# Patient Record
Sex: Male | Born: 1970 | Race: White | Hispanic: No | Marital: Single | Smoking: Current every day smoker
Health system: Southern US, Community
[De-identification: ages and names within clinical notes are randomized; demographics above are authoritative.]

## PROBLEM LIST (undated history)

## (undated) DIAGNOSIS — J438 Other emphysema: Secondary | ICD-10-CM

## (undated) DIAGNOSIS — N401 Enlarged prostate with lower urinary tract symptoms: Secondary | ICD-10-CM

## (undated) DIAGNOSIS — S3992XA Unspecified injury of lower back, initial encounter: Secondary | ICD-10-CM

## (undated) DIAGNOSIS — E559 Vitamin D deficiency, unspecified: Secondary | ICD-10-CM

## (undated) HISTORY — DX: Other emphysema: J43.8

## (undated) HISTORY — PX: OTHER SURGICAL HISTORY: SHX169

## (undated) HISTORY — DX: Vitamin D deficiency, unspecified: E55.9

## (undated) HISTORY — DX: Benign prostatic hyperplasia with lower urinary tract symptoms: N40.1

---

## 2010-03-19 ENCOUNTER — Inpatient Hospital Stay (HOSPITAL_COMMUNITY)
Admission: EM | Admit: 2010-03-19 | Discharge: 2010-03-26 | DRG: 460 | Disposition: A | Discharge status: Rehabilitation Facility | Payer: No Typology Code available for payment source | Attending: Emergency Medicine | Admitting: Emergency Medicine

## 2010-03-19 DIAGNOSIS — M439 Deforming dorsopathy, unspecified: Secondary | ICD-10-CM | POA: Diagnosis present

## 2010-03-19 DIAGNOSIS — Z880 Allergy status to penicillin: Secondary | ICD-10-CM

## 2010-03-19 DIAGNOSIS — S32009A Unspecified fracture of unspecified lumbar vertebra, initial encounter for closed fracture: Principal | ICD-10-CM | POA: Diagnosis present

## 2010-03-19 DIAGNOSIS — M48061 Spinal stenosis, lumbar region without neurogenic claudication: Secondary | ICD-10-CM | POA: Diagnosis present

## 2010-03-19 DIAGNOSIS — IMO0002 Reserved for concepts with insufficient information to code with codable children: Secondary | ICD-10-CM | POA: Diagnosis present

## 2010-03-19 DIAGNOSIS — S12400A Unspecified displaced fracture of fifth cervical vertebra, initial encounter for closed fracture: Secondary | ICD-10-CM | POA: Diagnosis present

## 2010-03-19 DIAGNOSIS — D62 Acute posthemorrhagic anemia: Secondary | ICD-10-CM | POA: Diagnosis present

## 2010-03-19 DIAGNOSIS — F341 Dysthymic disorder: Secondary | ICD-10-CM | POA: Diagnosis present

## 2010-03-19 DIAGNOSIS — F1010 Alcohol abuse, uncomplicated: Secondary | ICD-10-CM | POA: Diagnosis present

## 2010-03-19 DIAGNOSIS — F17200 Nicotine dependence, unspecified, uncomplicated: Secondary | ICD-10-CM | POA: Diagnosis present

## 2010-03-19 DIAGNOSIS — S12300A Unspecified displaced fracture of fourth cervical vertebra, initial encounter for closed fracture: Secondary | ICD-10-CM | POA: Diagnosis present

## 2010-03-19 DIAGNOSIS — S12200A Unspecified displaced fracture of third cervical vertebra, initial encounter for closed fracture: Secondary | ICD-10-CM | POA: Diagnosis present

## 2010-03-19 DIAGNOSIS — R404 Transient alteration of awareness: Secondary | ICD-10-CM | POA: Diagnosis present

## 2010-03-19 LAB — CBC
HCT: 39.6 % (ref 39.0–52.0)
Hemoglobin: 14 g/dL (ref 13.0–17.0)
MCH: 32.1 pg (ref 26.0–34.0)
MCHC: 35.4 g/dL (ref 30.0–36.0)
MCV: 90.8 fL (ref 78.0–100.0)
RBC: 4.36 MIL/uL (ref 4.22–5.81)

## 2010-03-19 LAB — BASIC METABOLIC PANEL
BUN: 8 mg/dL (ref 6–23)
CO2: 22 mEq/L (ref 19–32)
Chloride: 102 mEq/L (ref 96–112)
Creatinine, Ser: 0.91 mg/dL (ref 0.4–1.5)
Glucose, Bld: 117 mg/dL — ABNORMAL HIGH (ref 70–99)
Potassium: 3.2 mEq/L — ABNORMAL LOW (ref 3.5–5.1)

## 2010-03-19 LAB — DIFFERENTIAL
Basophils Relative: 0 % (ref 0–1)
Lymphs Abs: 3.4 10*3/uL (ref 0.7–4.0)
Monocytes Absolute: 1.1 10*3/uL — ABNORMAL HIGH (ref 0.1–1.0)
Monocytes Relative: 4 % (ref 3–12)
Neutro Abs: 19.5 10*3/uL — ABNORMAL HIGH (ref 1.7–7.7)
Neutrophils Relative %: 81 % — ABNORMAL HIGH (ref 43–77)

## 2010-03-19 LAB — ETHANOL: Alcohol, Ethyl (B): 172 mg/dL — ABNORMAL HIGH (ref 0–10)

## 2010-03-20 LAB — BASIC METABOLIC PANEL
BUN: 6 mg/dL (ref 6–23)
CO2: 21 mEq/L (ref 19–32)
Calcium: 8.5 mg/dL (ref 8.4–10.5)
Glucose, Bld: 104 mg/dL — ABNORMAL HIGH (ref 70–99)
Sodium: 139 mEq/L (ref 135–145)

## 2010-03-20 LAB — CBC
HCT: 36.9 % — ABNORMAL LOW (ref 39.0–52.0)
Hemoglobin: 12.5 g/dL — ABNORMAL LOW (ref 13.0–17.0)
MCHC: 33.9 g/dL (ref 30.0–36.0)
MCV: 91.6 fL (ref 78.0–100.0)
RDW: 12.9 % (ref 11.5–15.5)

## 2010-03-20 LAB — TYPE AND SCREEN

## 2010-03-20 LAB — ABO/RH: ABO/RH(D): O POS

## 2010-03-20 LAB — MRSA PCR SCREENING: MRSA by PCR: NEGATIVE

## 2010-03-22 LAB — CBC
HCT: 30.4 % — ABNORMAL LOW (ref 39.0–52.0)
Hemoglobin: 10.5 g/dL — ABNORMAL LOW (ref 13.0–17.0)
MCH: 31.3 pg (ref 26.0–34.0)
RBC: 3.36 MIL/uL — ABNORMAL LOW (ref 4.22–5.81)

## 2010-03-22 LAB — BASIC METABOLIC PANEL
CO2: 22 mEq/L (ref 19–32)
Chloride: 104 mEq/L (ref 96–112)
GFR calc Af Amer: >60 mL/min (ref >60)
Glucose, Bld: 143 mg/dL — ABNORMAL HIGH (ref 70–99)
Sodium: 134 mEq/L — ABNORMAL LOW (ref 135–145)
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
## 2010-03-24 LAB — BASIC METABOLIC PANEL
CO2: 26 mEq/L (ref 19–32)
Glucose, Bld: 107 mg/dL — ABNORMAL HIGH (ref 70–99)
Potassium: 4.5 mEq/L (ref 3.5–5.1)
Sodium: 134 mEq/L — ABNORMAL LOW (ref 135–145)

## 2010-03-25 DIAGNOSIS — S22009B Unspecified fracture of unspecified thoracic vertebra, initial encounter for open fracture: Secondary | ICD-10-CM

## 2010-03-25 DIAGNOSIS — S32009B Unspecified fracture of unspecified lumbar vertebra, initial encounter for open fracture: Secondary | ICD-10-CM

## 2010-03-25 DIAGNOSIS — S32009A Unspecified fracture of unspecified lumbar vertebra, initial encounter for closed fracture: Secondary | ICD-10-CM
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
## 2010-03-26 ENCOUNTER — Inpatient Hospital Stay (HOSPITAL_COMMUNITY)
Admission: RE | Admit: 2010-03-26 | Discharge: 2010-04-03 | DRG: 945 | Disposition: A | Discharge status: Home / Self Care | Payer: No Typology Code available for payment source | Source: Other Acute Inpatient Hospital | Attending: Physical Medicine & Rehabilitation | Admitting: Physical Medicine & Rehabilitation

## 2010-03-26 DIAGNOSIS — F341 Dysthymic disorder: Secondary | ICD-10-CM

## 2010-03-26 DIAGNOSIS — K5900 Constipation, unspecified: Secondary | ICD-10-CM

## 2010-03-26 DIAGNOSIS — S129XXA Fracture of neck, unspecified, initial encounter: Secondary | ICD-10-CM

## 2010-03-26 DIAGNOSIS — F17200 Nicotine dependence, unspecified, uncomplicated: Secondary | ICD-10-CM

## 2010-03-26 DIAGNOSIS — S12400A Unspecified displaced fracture of fifth cervical vertebra, initial encounter for closed fracture: Secondary | ICD-10-CM

## 2010-03-26 DIAGNOSIS — D62 Acute posthemorrhagic anemia: Secondary | ICD-10-CM

## 2010-03-26 DIAGNOSIS — S12200A Unspecified displaced fracture of third cervical vertebra, initial encounter for closed fracture: Secondary | ICD-10-CM

## 2010-03-26 DIAGNOSIS — Z79899 Other long term (current) drug therapy: Secondary | ICD-10-CM

## 2010-03-26 DIAGNOSIS — Z5189 Encounter for other specified aftercare: Principal | ICD-10-CM

## 2010-03-26 DIAGNOSIS — R000 Tachycardia, unspecified: Secondary | ICD-10-CM

## 2010-03-26 DIAGNOSIS — S12300A Unspecified displaced fracture of fourth cervical vertebra, initial encounter for closed fracture: Secondary | ICD-10-CM

## 2010-03-26 DIAGNOSIS — S32009A Unspecified fracture of unspecified lumbar vertebra, initial encounter for closed fracture: Secondary | ICD-10-CM

## 2010-03-26 DIAGNOSIS — K560 Paralytic ileus: Secondary | ICD-10-CM

## 2010-03-26 DIAGNOSIS — Z9889 Other specified postprocedural states: Secondary | ICD-10-CM

## 2010-03-27 DIAGNOSIS — S32009B Unspecified fracture of unspecified lumbar vertebra, initial encounter for open fracture: Secondary | ICD-10-CM

## 2010-03-27 DIAGNOSIS — S22009B Unspecified fracture of unspecified thoracic vertebra, initial encounter for open fracture: Secondary | ICD-10-CM

## 2010-03-27 DIAGNOSIS — S32009A Unspecified fracture of unspecified lumbar vertebra, initial encounter for closed fracture: Secondary | ICD-10-CM

## 2010-03-28 ENCOUNTER — Inpatient Hospital Stay (HOSPITAL_COMMUNITY): Payer: No Typology Code available for payment source

## 2010-03-29 ENCOUNTER — Inpatient Hospital Stay (HOSPITAL_COMMUNITY): Payer: No Typology Code available for payment source

## 2010-03-29 DIAGNOSIS — F341 Dysthymic disorder: Secondary | ICD-10-CM

## 2010-03-29 DIAGNOSIS — S129XXA Fracture of neck, unspecified, initial encounter: Secondary | ICD-10-CM

## 2010-03-29 DIAGNOSIS — K560 Paralytic ileus: Secondary | ICD-10-CM

## 2010-03-29 DIAGNOSIS — S32009A Unspecified fracture of unspecified lumbar vertebra, initial encounter for closed fracture: Secondary | ICD-10-CM

## 2010-03-29 LAB — DIFFERENTIAL
Basophils Relative: 0 % (ref 0–1)
Lymphocytes Relative: 13 % (ref 12–46)
Lymphs Abs: 2.3 10*3/uL (ref 0.7–4.0)
Monocytes Relative: 10 % (ref 3–12)
Neutro Abs: 13.4 10*3/uL — ABNORMAL HIGH (ref 1.7–7.7)
Neutrophils Relative %: 75 % (ref 43–77)

## 2010-03-29 LAB — COMPREHENSIVE METABOLIC PANEL
AST: 21 U/L (ref 0–37)
Albumin: 2.8 g/dL — ABNORMAL LOW (ref 3.5–5.2)
BUN: 16 mg/dL (ref 6–23)
Calcium: 8.5 mg/dL (ref 8.4–10.5)
Chloride: 95 mEq/L — ABNORMAL LOW (ref 96–112)
Creatinine, Ser: 0.51 mg/dL (ref 0.4–1.5)
GFR calc Af Amer: >60 mL/min (ref >60)
GFR calc non Af Amer: >60 mL/min (ref >60)
Total Bilirubin: 0.6 mg/dL (ref 0.3–1.2)

## 2010-03-29 LAB — URINALYSIS, ROUTINE W REFLEX MICROSCOPIC
Bilirubin Urine: NEGATIVE
Hgb urine dipstick: NEGATIVE
Specific Gravity, Urine: 1.024 (ref 1.005–1.030)
Urine Glucose, Fasting: NEGATIVE mg/dL
pH: 7.5 (ref 5.0–8.0)

## 2010-03-29 LAB — CBC
MCH: 30.2 pg (ref 26.0–34.0)
MCHC: 34.1 g/dL (ref 30.0–36.0)
MCV: 88.7 fL (ref 78.0–100.0)
Platelets: 552 10*3/uL — ABNORMAL HIGH (ref 150–400)
RBC: 3.11 MIL/uL — ABNORMAL LOW (ref 4.22–5.81)

## 2010-03-30 LAB — BASIC METABOLIC PANEL
Chloride: 91 mEq/L — ABNORMAL LOW (ref 96–112)
GFR calc non Af Amer: >60 mL/min (ref >60)
Glucose, Bld: 76 mg/dL (ref 70–99)
Potassium: 3.9 mEq/L (ref 3.5–5.1)
Sodium: 126 mEq/L — ABNORMAL LOW (ref 135–145)

## 2010-03-31 ENCOUNTER — Inpatient Hospital Stay (HOSPITAL_COMMUNITY): Payer: No Typology Code available for payment source

## 2010-03-31 LAB — BASIC METABOLIC PANEL
CO2: 26 mEq/L (ref 19–32)
Chloride: 91 mEq/L — ABNORMAL LOW (ref 96–112)
GFR calc Af Amer: >60 mL/min (ref >60)
Potassium: 4.2 mEq/L (ref 3.5–5.1)
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
## 2010-04-01 LAB — URINALYSIS, ROUTINE W REFLEX MICROSCOPIC
Hgb urine dipstick: NEGATIVE
Urine Glucose, Fasting: NEGATIVE mg/dL
pH: 6.5 (ref 5.0–8.0)

## 2010-04-02 LAB — URINE CULTURE
Colony Count: NO GROWTH
Culture  Setup Time: 201202090850
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
## 2010-05-12 ENCOUNTER — Inpatient Hospital Stay: Payer: No Typology Code available for payment source | Admitting: Physical Medicine & Rehabilitation

## 2010-05-14 ENCOUNTER — Inpatient Hospital Stay: Payer: No Typology Code available for payment source | Admitting: Physical Medicine & Rehabilitation

## 2011-11-07 IMAGING — CR DG ABDOMEN 1V
1 series · 1 of 1 positions shown · non-contrast
Comparison: 03/28/2010

CLINICAL DATA: Ileus.

ABDOMEN - 1 VIEW

[t abdomen supine]
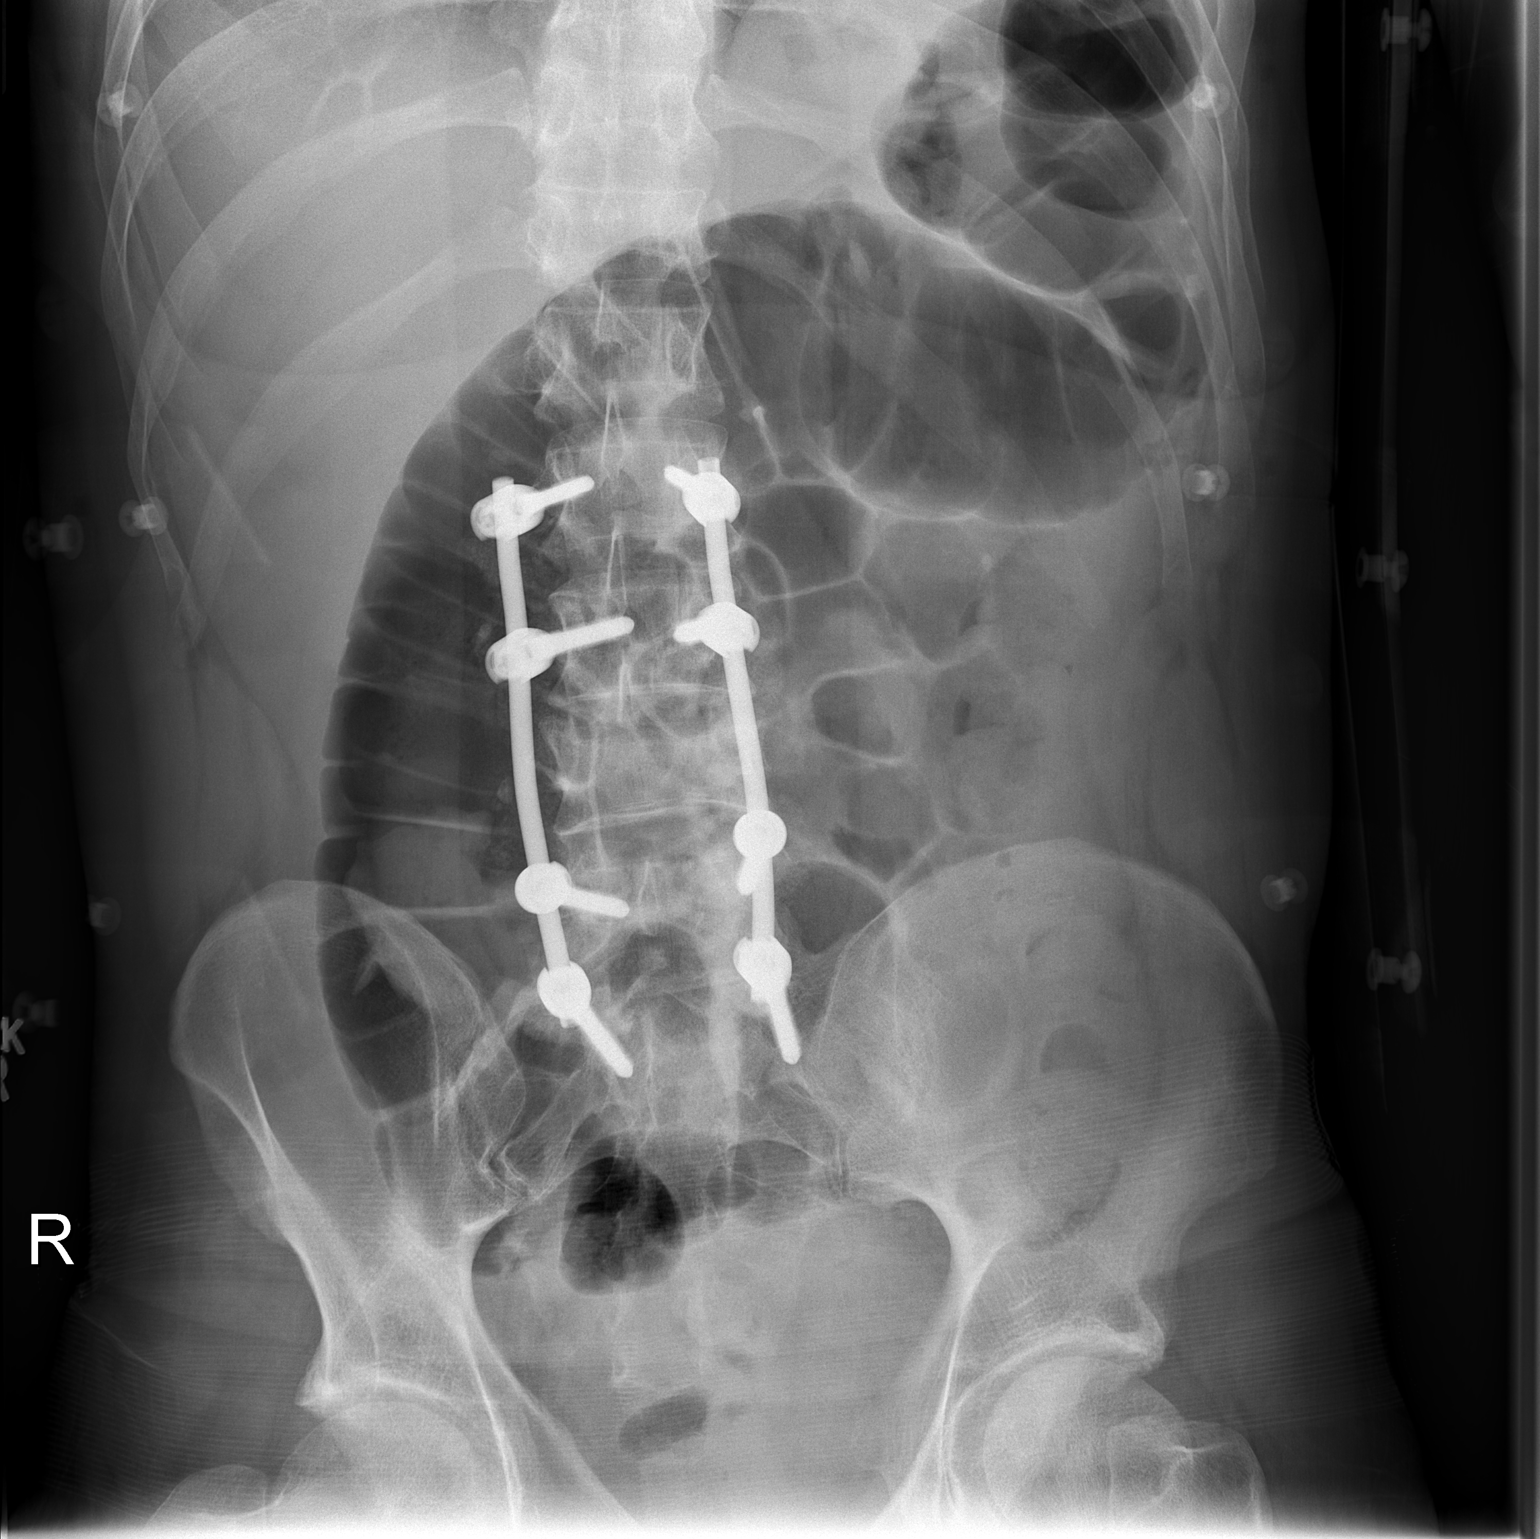

[1 of 1 positions shown; findings below may reference images not displayed]

FINDINGS: The amount of air in the colon has slightly diminished
since the prior study.  There is a moderate amount of stool in the
distal colon and there is a small amount of air in nondistended
small bowel loops.

Evidence of extensive lumbar fusion.
IMPRESSION: Slight improvement in the mild ileus.

## 2014-11-18 ENCOUNTER — Emergency Department (HOSPITAL_COMMUNITY)
Admission: EM | Admit: 2014-11-18 | Discharge: 2014-11-18 | Disposition: A | Discharge status: Home / Self Care | Payer: 59 | Attending: Emergency Medicine | Admitting: Emergency Medicine

## 2014-11-18 ENCOUNTER — Emergency Department (HOSPITAL_COMMUNITY): Payer: 59

## 2014-11-18 ENCOUNTER — Encounter (HOSPITAL_COMMUNITY): Payer: Self-pay | Admitting: Emergency Medicine

## 2014-11-18 DIAGNOSIS — N1330 Unspecified hydronephrosis: Secondary | ICD-10-CM

## 2014-11-18 DIAGNOSIS — G8929 Other chronic pain: Secondary | ICD-10-CM | POA: Insufficient documentation

## 2014-11-18 DIAGNOSIS — R339 Retention of urine, unspecified: Secondary | ICD-10-CM | POA: Diagnosis present

## 2014-11-18 DIAGNOSIS — M545 Low back pain: Secondary | ICD-10-CM | POA: Diagnosis not present

## 2014-11-18 DIAGNOSIS — Z87828 Personal history of other (healed) physical injury and trauma: Secondary | ICD-10-CM | POA: Insufficient documentation

## 2014-11-18 DIAGNOSIS — R338 Other retention of urine: Secondary | ICD-10-CM

## 2014-11-18 HISTORY — DX: Unspecified injury of lower back, initial encounter: S39.92XA

## 2014-11-18 LAB — BASIC METABOLIC PANEL
Anion gap: 8 (ref 5–15)
BUN: 16 mg/dL (ref 6–20)
CALCIUM: 9.3 mg/dL (ref 8.9–10.3)
CHLORIDE: 103 mmol/L (ref 101–111)
CO2: 25 mmol/L (ref 22–32)
Creatinine, Ser: 1.08 mg/dL (ref 0.61–1.24)
GFR calc Af Amer: >60 mL/min (ref >60)
Glucose, Bld: 85 mg/dL (ref 65–99)
POTASSIUM: 3.5 mmol/L (ref 3.5–5.1)
Sodium: 136 mmol/L (ref 135–145)

## 2014-11-18 LAB — URINALYSIS, ROUTINE W REFLEX MICROSCOPIC
Bilirubin Urine: NEGATIVE
Glucose, UA: NEGATIVE mg/dL
Hgb urine dipstick: NEGATIVE
Ketones, ur: NEGATIVE mg/dL
Leukocytes, UA: NEGATIVE
Nitrite: POSITIVE — AB
Protein, ur: NEGATIVE mg/dL
Specific Gravity, Urine: 1.02 (ref 1.005–1.030)
Urobilinogen, UA: 1 mg/dL (ref 0.0–1.0)
pH: 6.5 (ref 5.0–8.0)

## 2014-11-18 LAB — CBC WITH DIFFERENTIAL/PLATELET
Basophils Absolute: 0.1 10*3/uL (ref 0.0–0.1)
Basophils Relative: 1 %
Eosinophils Absolute: 0.7 10*3/uL (ref 0.0–0.7)
Eosinophils Relative: 7 %
HEMATOCRIT: 34.3 % — AB (ref 39.0–52.0)
HEMOGLOBIN: 11.9 g/dL — AB (ref 13.0–17.0)
LYMPHS ABS: 2.8 10*3/uL (ref 0.7–4.0)
LYMPHS PCT: 28 %
MCH: 31.2 pg (ref 26.0–34.0)
MCHC: 34.7 g/dL (ref 30.0–36.0)
MCV: 89.8 fL (ref 78.0–100.0)
MONOS PCT: 7 %
Monocytes Absolute: 0.7 10*3/uL (ref 0.1–1.0)
NEUTROS ABS: 5.8 10*3/uL (ref 1.7–7.7)
NEUTROS PCT: 57 %
Platelets: 349 10*3/uL (ref 150–400)
RBC: 3.82 MIL/uL — AB (ref 4.22–5.81)
RDW: 12.3 % (ref 11.5–15.5)
WBC: 10.1 10*3/uL (ref 4.0–10.5)

## 2014-11-18 LAB — URINE MICROSCOPIC-ADD ON

## 2014-11-18 MED ORDER — HYDROCODONE-ACETAMINOPHEN 5-325 MG PO TABS: ORAL_TABLET | ORAL | Status: DC

## 2014-11-18 MED ORDER — HYDROMORPHONE HCL 1 MG/ML IJ SOLN
0.5000 mg | Freq: Once | INTRAMUSCULAR | Status: AC
  Administered 2014-11-18: 0.5 mg via INTRAVENOUS
  Filled 2014-11-18: qty 1

## 2014-11-18 MED ORDER — SODIUM CHLORIDE 0.9 % IV BOLUS (SEPSIS)
1000.0000 mL | Freq: Once | INTRAVENOUS | Status: AC
  Administered 2014-11-18: 1000 mL via INTRAVENOUS

## 2014-11-18 NOTE — ED Notes (Signed)
Patient states that he has trouble emptying his bladder.  States that he dribbles at times andis unable to control this. C/O having low back pain at this time.

## 2014-11-18 NOTE — Discharge Instructions (Signed)
Please follow with your primary care doctor in the next 2 days for a check-up. They must obtain records for further management.  ° °Do not hesitate to return to the Emergency Department for any new, worsening or concerning symptoms.  ° ° °Acute Urinary Retention °Acute urinary retention is the temporary inability to urinate. °This is a common problem in older men. As men age their prostates become larger and block the flow of urine from the bladder. This is usually a problem that has come on gradually.  °HOME CARE INSTRUCTIONS °If you are sent home with a Foley catheter and a drainage system, you will need to discuss the best course of action with your health care provider. While the catheter is in, maintain a good intake of fluids. Keep the drainage bag emptied and lower than your catheter. This is so that contaminated urine will not flow back into your bladder, which could lead to a urinary tract infection. °There are two main types of drainage bags. One is a large bag that usually is used at night. It has a good capacity that will allow you to sleep through the night without having to empty it. The second type is called a leg bag. It has a smaller capacity, so it needs to be emptied more frequently. However, the main advantage is that it can be attached by a leg strap and can go underneath your clothing, allowing you the freedom to move about or leave your home. °Only take over-the-counter or prescription medicines for pain, discomfort, or fever as directed by your health care provider.  °SEEK MEDICAL CARE IF: °· You develop a low-grade fever. °· You experience spasms or leakage of urine with the spasms. °SEEK IMMEDIATE MEDICAL CARE IF:  °· You develop chills or fever. °· Your catheter stops draining urine. °· Your catheter falls out. °· You start to develop increased bleeding that does not respond to rest and increased fluid intake. °MAKE SURE YOU: °· Understand these instructions. °· Will watch your  condition. °· Will get help right away if you are not doing well or get worse. °Document Released: 05/16/2000 Document Revised: 02/12/2013 Document Reviewed: 07/19/2012 °ExitCare® Patient Information ©2015 [employer]. This information is not intended to replace advice given to you by your health care provider. Make sure you discuss any questions you have with your health care provider. ° °

## 2014-11-18 NOTE — ED Notes (Signed)
Patient transported to MRI 
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
## 2014-11-18 NOTE — ED Notes (Signed)
Bladder scan done. 130 ml in bladder.

## 2014-11-18 NOTE — ED Notes (Signed)
Pt sts urinary retention and difficulty controlling flow; pt sts pain in lower back with hx of back sx; pt sts woke up once and had urinated on himself

## 2014-11-19 LAB — URINE CULTURE

## 2016-02-18 DIAGNOSIS — N401 Enlarged prostate with lower urinary tract symptoms: Secondary | ICD-10-CM | POA: Diagnosis not present

## 2016-02-18 DIAGNOSIS — N281 Cyst of kidney, acquired: Secondary | ICD-10-CM | POA: Diagnosis not present

## 2016-02-18 DIAGNOSIS — R338 Other retention of urine: Secondary | ICD-10-CM | POA: Diagnosis not present

## 2017-02-16 DIAGNOSIS — N401 Enlarged prostate with lower urinary tract symptoms: Secondary | ICD-10-CM | POA: Diagnosis not present

## 2017-02-16 DIAGNOSIS — N281 Cyst of kidney, acquired: Secondary | ICD-10-CM | POA: Diagnosis not present

## 2017-02-16 DIAGNOSIS — R338 Other retention of urine: Secondary | ICD-10-CM | POA: Diagnosis not present

## 2017-05-31 DIAGNOSIS — R918 Other nonspecific abnormal finding of lung field: Secondary | ICD-10-CM | POA: Diagnosis not present

## 2017-05-31 DIAGNOSIS — T50904A Poisoning by unspecified drugs, medicaments and biological substances, undetermined, initial encounter: Secondary | ICD-10-CM | POA: Diagnosis not present

## 2017-05-31 DIAGNOSIS — T424X4A Poisoning by benzodiazepines, undetermined, initial encounter: Secondary | ICD-10-CM | POA: Diagnosis not present

## 2017-05-31 DIAGNOSIS — T401X1A Poisoning by heroin, accidental (unintentional), initial encounter: Secondary | ICD-10-CM | POA: Diagnosis not present

## 2017-11-10 DIAGNOSIS — F17210 Nicotine dependence, cigarettes, uncomplicated: Secondary | ICD-10-CM | POA: Diagnosis not present

## 2017-11-10 DIAGNOSIS — R404 Transient alteration of awareness: Secondary | ICD-10-CM | POA: Diagnosis not present

## 2017-11-10 DIAGNOSIS — R410 Disorientation, unspecified: Secondary | ICD-10-CM | POA: Diagnosis not present

## 2017-11-10 DIAGNOSIS — Z79899 Other long term (current) drug therapy: Secondary | ICD-10-CM | POA: Diagnosis not present

## 2017-11-10 DIAGNOSIS — R079 Chest pain, unspecified: Secondary | ICD-10-CM | POA: Diagnosis not present

## 2017-11-10 DIAGNOSIS — R4182 Altered mental status, unspecified: Secondary | ICD-10-CM | POA: Diagnosis not present

## 2017-11-10 DIAGNOSIS — R55 Syncope and collapse: Secondary | ICD-10-CM | POA: Diagnosis not present

## 2017-11-10 DIAGNOSIS — R4020 Unspecified coma: Secondary | ICD-10-CM | POA: Diagnosis not present

## 2017-11-14 DIAGNOSIS — F1110 Opioid abuse, uncomplicated: Secondary | ICD-10-CM | POA: Diagnosis not present

## 2017-11-14 DIAGNOSIS — R4182 Altered mental status, unspecified: Secondary | ICD-10-CM | POA: Diagnosis not present

## 2017-11-14 DIAGNOSIS — Z0489 Encounter for examination and observation for other specified reasons: Secondary | ICD-10-CM | POA: Diagnosis not present

## 2017-11-14 DIAGNOSIS — F17210 Nicotine dependence, cigarettes, uncomplicated: Secondary | ICD-10-CM | POA: Diagnosis not present

## 2017-11-15 DIAGNOSIS — E782 Mixed hyperlipidemia: Secondary | ICD-10-CM | POA: Diagnosis not present

## 2017-11-15 DIAGNOSIS — J439 Emphysema, unspecified: Secondary | ICD-10-CM | POA: Diagnosis not present

## 2017-11-15 DIAGNOSIS — N401 Enlarged prostate with lower urinary tract symptoms: Secondary | ICD-10-CM | POA: Diagnosis not present

## 2017-11-15 DIAGNOSIS — E559 Vitamin D deficiency, unspecified: Secondary | ICD-10-CM | POA: Diagnosis not present

## 2017-11-15 DIAGNOSIS — R790 Abnormal level of blood mineral: Secondary | ICD-10-CM | POA: Diagnosis not present

## 2017-11-15 DIAGNOSIS — Z716 Tobacco abuse counseling: Secondary | ICD-10-CM | POA: Diagnosis not present

## 2017-11-15 DIAGNOSIS — R5383 Other fatigue: Secondary | ICD-10-CM | POA: Diagnosis not present

## 2017-12-14 DIAGNOSIS — F330 Major depressive disorder, recurrent, mild: Secondary | ICD-10-CM | POA: Diagnosis not present

## 2017-12-14 DIAGNOSIS — J438 Other emphysema: Secondary | ICD-10-CM | POA: Diagnosis not present

## 2018-02-19 DIAGNOSIS — R338 Other retention of urine: Secondary | ICD-10-CM | POA: Diagnosis not present

## 2018-02-19 DIAGNOSIS — N401 Enlarged prostate with lower urinary tract symptoms: Secondary | ICD-10-CM | POA: Diagnosis not present

## 2018-02-19 DIAGNOSIS — N281 Cyst of kidney, acquired: Secondary | ICD-10-CM | POA: Diagnosis not present

## 2018-02-19 DIAGNOSIS — Z125 Encounter for screening for malignant neoplasm of prostate: Secondary | ICD-10-CM | POA: Diagnosis not present

## 2018-04-19 DIAGNOSIS — R338 Other retention of urine: Secondary | ICD-10-CM | POA: Diagnosis not present

## 2018-04-19 DIAGNOSIS — N281 Cyst of kidney, acquired: Secondary | ICD-10-CM | POA: Diagnosis not present

## 2018-08-22 DIAGNOSIS — R5382 Chronic fatigue, unspecified: Secondary | ICD-10-CM | POA: Diagnosis not present

## 2019-02-06 DIAGNOSIS — Z23 Encounter for immunization: Secondary | ICD-10-CM | POA: Diagnosis not present

## 2019-02-06 DIAGNOSIS — N401 Enlarged prostate with lower urinary tract symptoms: Secondary | ICD-10-CM | POA: Diagnosis not present

## 2019-02-21 DIAGNOSIS — R338 Other retention of urine: Secondary | ICD-10-CM | POA: Diagnosis not present

## 2019-02-21 DIAGNOSIS — N401 Enlarged prostate with lower urinary tract symptoms: Secondary | ICD-10-CM | POA: Diagnosis not present

## 2019-02-21 DIAGNOSIS — Z125 Encounter for screening for malignant neoplasm of prostate: Secondary | ICD-10-CM | POA: Diagnosis not present

## 2019-08-08 ENCOUNTER — Encounter: Payer: Self-pay | Admitting: Physician Assistant

## 2019-09-27 ENCOUNTER — Ambulatory Visit (INDEPENDENT_AMBULATORY_CARE_PROVIDER_SITE_OTHER): Payer: BC Managed Care – PPO | Admitting: Physician Assistant

## 2019-09-27 ENCOUNTER — Encounter: Payer: Self-pay | Admitting: Physician Assistant

## 2019-09-27 ENCOUNTER — Other Ambulatory Visit: Payer: Self-pay

## 2019-09-27 VITALS — BP 122/70 | HR 97 | Temp 97.9°F | Ht 69.0 in | Wt 128.0 lb

## 2019-09-27 DIAGNOSIS — Z0000 Encounter for general adult medical examination without abnormal findings: Secondary | ICD-10-CM | POA: Insufficient documentation

## 2019-09-27 NOTE — Assessment & Plan Note (Signed)
Wellness handout given labwork pending 

## 2019-09-27 NOTE — Patient Instructions (Signed)
Preventive Care 49-49 Years Old, Male Preventive care refers to lifestyle choices and visits with your health care provider that can promote health and wellness. This includes:  A yearly physical exam. This is also called an annual well check.  Regular dental and eye exams.  Immunizations.  Screening for certain conditions.  Healthy lifestyle choices, such as eating a healthy diet, getting regular exercise, not using drugs or products that contain nicotine and tobacco, and limiting alcohol use. What can I expect for my preventive care visit? Physical exam Your health care provider will check:  Height and weight. These may be used to calculate body mass index (BMI), which is a measurement that tells if you are at a healthy weight.  Heart rate and blood pressure.  Your skin for abnormal spots. Counseling Your health care provider may ask you questions about:  Alcohol, tobacco, and drug use.  Emotional well-being.  Home and relationship well-being.  Sexual activity.  Eating habits.  Work and work Statistician. What immunizations do I need?  Influenza (flu) vaccine  This is recommended every year. Tetanus, diphtheria, and pertussis (Tdap) vaccine  You may need a Td booster every 10 years. Varicella (chickenpox) vaccine  You may need this vaccine if you have not already been vaccinated. Zoster (shingles) vaccine  You may need this after age 49. Measles, mumps, and rubella (MMR) vaccine  You may need at least one dose of MMR if you were born in 1957 or later. You may also need a second dose. Pneumococcal conjugate (PCV13) vaccine  You may need this if you have certain conditions and were not previously vaccinated. Pneumococcal polysaccharide (PPSV23) vaccine  You may need one or two doses if you smoke cigarettes or if you have certain conditions. Meningococcal conjugate (MenACWY) vaccine  You may need this if you have certain conditions. Hepatitis A  vaccine  You may need this if you have certain conditions or if you travel or work in places where you may be exposed to hepatitis A. Hepatitis B vaccine  You may need this if you have certain conditions or if you travel or work in places where you may be exposed to hepatitis B. Haemophilus influenzae type b (Hib) vaccine  You may need this if you have certain risk factors. Human papillomavirus (HPV) vaccine  If recommended by your health care provider, you may need three doses over 6 months. You may receive vaccines as individual doses or as more than one vaccine together in one shot (combination vaccines). Talk with your health care provider about the risks and benefits of combination vaccines. What tests do I need? Blood tests  Lipid and cholesterol levels. These may be checked every 5 years, or more frequently if you are over 60 years old.  Hepatitis C test.  Hepatitis B test. Screening  Lung cancer screening. You may have this screening every year starting at age 49 if you have a 30-pack-year history of smoking and currently smoke or have quit within the past 15 years.  Prostate cancer screening. Recommendations will vary depending on your family history and other risks.  Colorectal cancer screening. All adults should have this screening starting at age 49 and continuing until age 49. Your health care provider may recommend screening at age 49 if you are at increased risk. You will have tests every 1-10 years, depending on your results and the type of screening test.  Diabetes screening. This is done by checking your blood sugar (glucose) after you have not eaten  for a while (fasting). You may have this done every 1-3 years.  Sexually transmitted disease (STD) testing. Follow these instructions at home: Eating and drinking  Eat a diet that includes fresh fruits and vegetables, whole grains, lean protein, and low-fat dairy products.  Take vitamin and mineral supplements as  recommended by your health care provider.  Do not drink alcohol if your health care provider tells you not to drink.  If you drink alcohol: ? Limit how much you have to 0-2 drinks a day. ? Be aware of how much alcohol is in your drink. In the [place] one drink equals one 12 oz bottle of beer (355 mL), one 5 oz glass of wine (148 mL), or one 1 oz glass of hard liquor (44 mL). Lifestyle  Take daily care of your teeth and gums.  Stay active. Exercise for at least 30 minutes on 5 or more days each week.  Do not use any products that contain nicotine or tobacco, such as cigarettes, e-cigarettes, and chewing tobacco. If you need help quitting, ask your health care provider.  If you are sexually active, practice safe sex. Use a condom or other form of protection to prevent STIs (sexually transmitted infections).  Talk with your health care provider about taking a low-dose aspirin every day starting at age 49. What's next?  Go to your health care provider once a year for a well check visit.  Ask your health care provider how often you should have your eyes and teeth checked.  Stay up to date on all vaccines. This information is not intended to replace advice given to you by your health care provider. Make sure you discuss any questions you have with your health care provider. Document Revised: 02/01/2018 Document Reviewed: 02/01/2018 Elsevier Patient Education  2020 [employer].

## 2019-09-27 NOTE — Progress Notes (Signed)
Subjective:  Patient ID: Keith Yates, male    DOB: 04-16-1970  Age: 49 y.o. MRN: [id number]  Chief Complaint  Patient presents with   Annual Exam    HPI  Well Adult Physical: Patient here for a comprehensive physical exam.The patient reports no problems Do you take any herbs or supplements that were not prescribed by a doctor? no Are you taking calcium supplements? no Are you taking aspirin daily? no  Encounter for general adult medical examination without abnormal findings  Physical ("At Risk" items are starred): Patient's last physical exam was 1 year ago .  Weight: Appropriate for height (BMI less than 27%) ;  Blood Pressure: Normal (BP less than 120/80) ;  Medical History: Patient history reviewed ; Family history reviewed ;  Allergies Reviewed: No change in current allergies ;  Medications Reviewed: Medications reviewed - no changes ;  Lipids: Normal lipid levels ;  Safety: reviewed ; Patient wears a seat belt, has smoke detectors, has carbon monoxide detectors, practices appropriate gun safety, and wears sunscreen with extended sun exposure. Dental Care: has dentures Hearing loss: none Vision impairments: none Last RWE:[id number] yearly with urology            Social History   Socioeconomic History   Marital status: Single    Spouse name: Not on file   Number of children: Not on file   Years of education: Not on file   Highest education level: Not on file  Occupational History   Not on file  Tobacco Use   Smoking status: Never Smoker  Substance and Sexual Activity   Alcohol use: No   Drug use: No   Sexual activity: Not on file  Other Topics Concern   Not on file  Social History Narrative   Not on file   Social Determinants of Health   Financial Resource Strain:    Difficulty of Paying Living Expenses:   Food Insecurity:    Worried About Running Out of Food in the Last Year:    Barista in the Last Year:   Transportation Needs:     Freight forwarder (Medical):    Lack of Transportation (Non-Medical):   Physical Activity:    Days of Exercise per Week:    Minutes of Exercise per Session:   Stress:    Feeling of Stress :   Social Connections:    Frequency of Communication with Friends and Family:    Frequency of Social Gatherings with Friends and Family:    Attends Religious Services:    Active Member of Clubs or Organizations:    Attends Banker Meetings:    Marital Status:    Past Medical History:  Diagnosis Date   Back injury    No past surgical history on file.  No family history on file. Social History   Socioeconomic History   Marital status: Single    Spouse name: Not on file   Number of children: Not on file   Years of education: Not on file   Highest education level: Not on file  Occupational History   Not on file  Tobacco Use   Smoking status: Never Smoker  Substance and Sexual Activity   Alcohol use: No   Drug use: No   Sexual activity: Not on file  Other Topics Concern   Not on file  Social History Narrative   Not on file   Social Determinants of Health   Financial Resource Strain:  Difficulty of Paying Living Expenses:   Food Insecurity:    Worried About Programme researcher, broadcasting/film/video in the Last Year:    Barista in the Last Year:   Transportation Needs:    Freight forwarder (Medical):    Lack of Transportation (Non-Medical):   Physical Activity:    Days of Exercise per Week:    Minutes of Exercise per Session:   Stress:    Feeling of Stress :   Social Connections:    Frequency of Communication with Friends and Family:    Frequency of Social Gatherings with Friends and Family:    Attends Religious Services:    Active Member of Clubs or Organizations:    Attends Engineer, structural:    Marital Status:    Review of Systems  CONSTITUTIONAL: Negative for chills, fatigue, fever, unintentional weight  gain and unintentional weight loss.  E/N/T: Negative for ear pain, nasal congestion and sore throat.  CARDIOVASCULAR: Negative for chest pain, dizziness, palpitations and pedal edema.  RESPIRATORY: Negative for recent cough and dyspnea.  GASTROINTESTINAL: Negative for abdominal pain, acid reflux symptoms, constipation, diarrhea, nausea and vomiting.  MSK: Negative for arthralgias and myalgias.  INTEGUMENTARY: Negative for rash.  NEUROLOGICAL: Negative for dizziness and headaches.  PSYCHIATRIC: Negative for sleep disturbance and to question depression screen.  Negative for depression, negative for anhedonia.      Objective:  BP 122/70 (BP Location: Left Arm, Patient Position: Sitting)    Pulse 97    Temp 97.9 F (36.6 C) (Temporal)    Ht 5\' 9"  (1.753 m)    Wt 128 lb (58.1 kg)    SpO2 98%    BMI 18.90 kg/m   BP/Weight 09/27/2019 11/18/2014  Systolic BP 122 143  Diastolic BP 70 69  Wt. (Lbs) 128 -  BMI 18.9 -    Physical Exam PHYSICAL EXAM:   VS: BP 122/70 (BP Location: Left Arm, Patient Position: Sitting)    Pulse 97    Temp 97.9 F (36.6 C) (Temporal)    Ht 5\' 9"  (1.753 m)    Wt 128 lb (58.1 kg)    SpO2 98%    BMI 18.90 kg/m   GEN: Well nourished, well developed, in no acute distress  HEENT: normal external ears and nose - normal external auditory canals and TMS - hearing grossly normal - normal nasal mucosa and septum - Lips, Teeth and Gums - normal  Oropharynx - normal mucosa, palate, and posterior pharynx Neck: no JVD or masses - no thyromegaly Cardiac: RRR; no murmurs, rubs, or gallops,no edema - no significant varicosities Respiratory:  normal respiratory rate and pattern with no distress - normal breath sounds with no rales, rhonchi, wheezes or rubs GI: normal bowel sounds, no masses or tenderness MS: no deformity or atrophy  Skin: warm and dry, no rash  Neuro:  Alert and Oriented x 3, Strength and sensation are intact - CN II-Xii grossly intact Psych: euthymic mood,  appropriate affect and demeanor Lab Results  Component Value Date   WBC 10.1 11/18/2014   HGB 11.9 (L) 11/18/2014   HCT 34.3 (L) 11/18/2014   PLT 349 11/18/2014   GLUCOSE 85 11/18/2014   ALT 32 03/29/2010   AST 21 03/29/2010   NA 136 11/18/2014   K 3.5 11/18/2014   CL 103 11/18/2014   CREATININE 1.08 11/18/2014   BUN 16 11/18/2014   CO2 25 11/18/2014      Assessment & Plan:  1. Routine physical  examination - CBC with Differential/Platelet - Comprehensive metabolic panel - TSH - Lipid panel    Body mass index is 18.9 kg/m.   These are the goals we discussed: Goals   None      This is a list of the screening recommended for you and due dates:  Health Maintenance  Topic Date Due    Hepatitis C: One time screening is recommended by Center for Disease Control  (CDC) for  adults born from [birth year] through 1965.   Never done   HIV Screening  Never done   Flu Shot  09/22/2019   COVID-19 Vaccine (2 - Pfizer 2-dose series) 09/30/2019   Tetanus Vaccine  02/05/2029     AN INDIVIDUALIZED CARE PLAN: was established or reinforced today.   SELF MANAGEMENT: The patient and I together assessed ways to personally work towards obtaining the recommended goals  Support needs The patient and/or family needs were assessed and services were offered if appropriate.  No orders of the defined types were placed in this encounter.   Follow-up: Return in about 1 year (around 09/26/2020).  An After Visit Summary was printed and given to the patient.  Vickey Sages [facility] [phone]

## 2019-09-28 LAB — CBC WITH DIFFERENTIAL/PLATELET
Basophils Absolute: 0.1 10*3/uL (ref 0.0–0.2)
Basos: 1 %
EOS (ABSOLUTE): 0.3 10*3/uL (ref 0.0–0.4)
Eos: 4 %
Hematocrit: 40.7 % (ref 37.5–51.0)
Hemoglobin: 14.1 g/dL (ref 13.0–17.7)
Immature Grans (Abs): 0 10*3/uL (ref 0.0–0.1)
Immature Granulocytes: 0 %
Lymphocytes Absolute: 1.9 10*3/uL (ref 0.7–3.1)
Lymphs: 25 %
MCH: 31.5 pg (ref 26.6–33.0)
MCHC: 34.6 g/dL (ref 31.5–35.7)
MCV: 91 fL (ref 79–97)
Monocytes Absolute: 0.7 10*3/uL (ref 0.1–0.9)
Monocytes: 9 %
Neutrophils Absolute: 4.5 10*3/uL (ref 1.4–7.0)
Neutrophils: 61 %
Platelets: 277 10*3/uL (ref 150–450)
RBC: 4.48 x10E6/uL (ref 4.14–5.80)
RDW: 13.7 % (ref 11.6–15.4)
WBC: 7.4 10*3/uL (ref 3.4–10.8)

## 2019-09-28 LAB — COMPREHENSIVE METABOLIC PANEL
ALT: 12 IU/L (ref 0–44)
AST: 15 IU/L (ref 0–40)
Albumin/Globulin Ratio: 1.7 (ref 1.2–2.2)
Albumin: 4.2 g/dL (ref 4.0–5.0)
Alkaline Phosphatase: 83 IU/L (ref 48–121)
BUN/Creatinine Ratio: 11 (ref 9–20)
BUN: 9 mg/dL (ref 6–24)
Bilirubin Total: 0.2 mg/dL (ref 0.0–1.2)
CO2: 22 mmol/L (ref 20–29)
Calcium: 9.7 mg/dL (ref 8.7–10.2)
Chloride: 103 mmol/L (ref 96–106)
Creatinine, Ser: 0.8 mg/dL (ref 0.76–1.27)
GFR calc Af Amer: 122 mL/min/{1.73_m2} (ref >59)
GFR calc non Af Amer: 106 mL/min/{1.73_m2} (ref >59)
Globulin, Total: 2.5 g/dL (ref 1.5–4.5)
Glucose: 102 mg/dL — ABNORMAL HIGH (ref 65–99)
Potassium: 5.2 mmol/L (ref 3.5–5.2)
Sodium: 136 mmol/L (ref 134–144)
Total Protein: 6.7 g/dL (ref 6.0–8.5)

## 2019-09-28 LAB — LIPID PANEL
Chol/HDL Ratio: 2.6 ratio (ref 0.0–5.0)
Cholesterol, Total: 165 mg/dL (ref 100–199)
HDL: 64 mg/dL (ref >39)
LDL Chol Calc (NIH): 91 mg/dL (ref 0–99)
Triglycerides: 47 mg/dL (ref 0–149)
VLDL Cholesterol Cal: 10 mg/dL (ref 5–40)

## 2019-09-28 LAB — TSH: TSH: 0.84 u[IU]/mL (ref 0.450–4.500)

## 2019-09-28 LAB — CARDIOVASCULAR RISK ASSESSMENT

## 2019-12-24 DIAGNOSIS — F1120 Opioid dependence, uncomplicated: Secondary | ICD-10-CM | POA: Diagnosis not present

## 2019-12-24 DIAGNOSIS — F322 Major depressive disorder, single episode, severe without psychotic features: Secondary | ICD-10-CM | POA: Diagnosis not present

## 2019-12-24 DIAGNOSIS — F1320 Sedative, hypnotic or anxiolytic dependence, uncomplicated: Secondary | ICD-10-CM | POA: Diagnosis not present

## 2019-12-25 DIAGNOSIS — F1320 Sedative, hypnotic or anxiolytic dependence, uncomplicated: Secondary | ICD-10-CM | POA: Diagnosis not present

## 2019-12-25 DIAGNOSIS — F322 Major depressive disorder, single episode, severe without psychotic features: Secondary | ICD-10-CM | POA: Diagnosis not present

## 2019-12-25 DIAGNOSIS — F1120 Opioid dependence, uncomplicated: Secondary | ICD-10-CM | POA: Diagnosis not present

## 2019-12-26 DIAGNOSIS — F322 Major depressive disorder, single episode, severe without psychotic features: Secondary | ICD-10-CM | POA: Diagnosis not present

## 2019-12-26 DIAGNOSIS — F1120 Opioid dependence, uncomplicated: Secondary | ICD-10-CM | POA: Diagnosis not present

## 2019-12-26 DIAGNOSIS — F1320 Sedative, hypnotic or anxiolytic dependence, uncomplicated: Secondary | ICD-10-CM | POA: Diagnosis not present

## 2019-12-27 DIAGNOSIS — F322 Major depressive disorder, single episode, severe without psychotic features: Secondary | ICD-10-CM | POA: Diagnosis not present

## 2019-12-27 DIAGNOSIS — F1120 Opioid dependence, uncomplicated: Secondary | ICD-10-CM | POA: Diagnosis not present

## 2019-12-27 DIAGNOSIS — F1320 Sedative, hypnotic or anxiolytic dependence, uncomplicated: Secondary | ICD-10-CM | POA: Diagnosis not present

## 2019-12-30 DIAGNOSIS — F1120 Opioid dependence, uncomplicated: Secondary | ICD-10-CM | POA: Diagnosis not present

## 2019-12-30 DIAGNOSIS — F322 Major depressive disorder, single episode, severe without psychotic features: Secondary | ICD-10-CM | POA: Diagnosis not present

## 2019-12-30 DIAGNOSIS — F1320 Sedative, hypnotic or anxiolytic dependence, uncomplicated: Secondary | ICD-10-CM | POA: Diagnosis not present

## 2019-12-31 DIAGNOSIS — F1120 Opioid dependence, uncomplicated: Secondary | ICD-10-CM | POA: Diagnosis not present

## 2019-12-31 DIAGNOSIS — F1320 Sedative, hypnotic or anxiolytic dependence, uncomplicated: Secondary | ICD-10-CM | POA: Diagnosis not present

## 2019-12-31 DIAGNOSIS — F322 Major depressive disorder, single episode, severe without psychotic features: Secondary | ICD-10-CM | POA: Diagnosis not present

## 2020-01-01 DIAGNOSIS — F322 Major depressive disorder, single episode, severe without psychotic features: Secondary | ICD-10-CM | POA: Diagnosis not present

## 2020-01-01 DIAGNOSIS — F1120 Opioid dependence, uncomplicated: Secondary | ICD-10-CM | POA: Diagnosis not present

## 2020-01-01 DIAGNOSIS — F1320 Sedative, hypnotic or anxiolytic dependence, uncomplicated: Secondary | ICD-10-CM | POA: Diagnosis not present

## 2020-01-02 DIAGNOSIS — F1320 Sedative, hypnotic or anxiolytic dependence, uncomplicated: Secondary | ICD-10-CM | POA: Diagnosis not present

## 2020-01-02 DIAGNOSIS — F1120 Opioid dependence, uncomplicated: Secondary | ICD-10-CM | POA: Diagnosis not present

## 2020-01-02 DIAGNOSIS — F322 Major depressive disorder, single episode, severe without psychotic features: Secondary | ICD-10-CM | POA: Diagnosis not present

## 2020-01-03 DIAGNOSIS — F1020 Alcohol dependence, uncomplicated: Secondary | ICD-10-CM | POA: Diagnosis not present

## 2020-01-03 DIAGNOSIS — F1320 Sedative, hypnotic or anxiolytic dependence, uncomplicated: Secondary | ICD-10-CM | POA: Diagnosis not present

## 2020-01-03 DIAGNOSIS — F1120 Opioid dependence, uncomplicated: Secondary | ICD-10-CM | POA: Diagnosis not present

## 2020-01-03 DIAGNOSIS — F322 Major depressive disorder, single episode, severe without psychotic features: Secondary | ICD-10-CM | POA: Diagnosis not present

## 2020-01-06 DIAGNOSIS — F1120 Opioid dependence, uncomplicated: Secondary | ICD-10-CM | POA: Diagnosis not present

## 2020-01-06 DIAGNOSIS — F1320 Sedative, hypnotic or anxiolytic dependence, uncomplicated: Secondary | ICD-10-CM | POA: Diagnosis not present

## 2020-01-06 DIAGNOSIS — F322 Major depressive disorder, single episode, severe without psychotic features: Secondary | ICD-10-CM | POA: Diagnosis not present

## 2020-01-07 DIAGNOSIS — F1120 Opioid dependence, uncomplicated: Secondary | ICD-10-CM | POA: Diagnosis not present

## 2020-01-07 DIAGNOSIS — F1320 Sedative, hypnotic or anxiolytic dependence, uncomplicated: Secondary | ICD-10-CM | POA: Diagnosis not present

## 2020-01-07 DIAGNOSIS — F322 Major depressive disorder, single episode, severe without psychotic features: Secondary | ICD-10-CM | POA: Diagnosis not present

## 2020-01-08 DIAGNOSIS — F1320 Sedative, hypnotic or anxiolytic dependence, uncomplicated: Secondary | ICD-10-CM | POA: Diagnosis not present

## 2020-01-08 DIAGNOSIS — F1120 Opioid dependence, uncomplicated: Secondary | ICD-10-CM | POA: Diagnosis not present

## 2020-01-08 DIAGNOSIS — F322 Major depressive disorder, single episode, severe without psychotic features: Secondary | ICD-10-CM | POA: Diagnosis not present

## 2020-01-09 DIAGNOSIS — F322 Major depressive disorder, single episode, severe without psychotic features: Secondary | ICD-10-CM | POA: Diagnosis not present

## 2020-01-09 DIAGNOSIS — F1320 Sedative, hypnotic or anxiolytic dependence, uncomplicated: Secondary | ICD-10-CM | POA: Diagnosis not present

## 2020-01-09 DIAGNOSIS — F1120 Opioid dependence, uncomplicated: Secondary | ICD-10-CM | POA: Diagnosis not present

## 2020-01-10 DIAGNOSIS — F1320 Sedative, hypnotic or anxiolytic dependence, uncomplicated: Secondary | ICD-10-CM | POA: Diagnosis not present

## 2020-01-10 DIAGNOSIS — F1020 Alcohol dependence, uncomplicated: Secondary | ICD-10-CM | POA: Diagnosis not present

## 2020-01-10 DIAGNOSIS — F322 Major depressive disorder, single episode, severe without psychotic features: Secondary | ICD-10-CM | POA: Diagnosis not present

## 2020-01-10 DIAGNOSIS — F1120 Opioid dependence, uncomplicated: Secondary | ICD-10-CM | POA: Diagnosis not present

## 2020-01-13 DIAGNOSIS — F322 Major depressive disorder, single episode, severe without psychotic features: Secondary | ICD-10-CM | POA: Diagnosis not present

## 2020-01-13 DIAGNOSIS — F1320 Sedative, hypnotic or anxiolytic dependence, uncomplicated: Secondary | ICD-10-CM | POA: Diagnosis not present

## 2020-01-13 DIAGNOSIS — F1120 Opioid dependence, uncomplicated: Secondary | ICD-10-CM | POA: Diagnosis not present

## 2020-01-14 DIAGNOSIS — F1320 Sedative, hypnotic or anxiolytic dependence, uncomplicated: Secondary | ICD-10-CM | POA: Diagnosis not present

## 2020-01-14 DIAGNOSIS — F1120 Opioid dependence, uncomplicated: Secondary | ICD-10-CM | POA: Diagnosis not present

## 2020-01-14 DIAGNOSIS — F322 Major depressive disorder, single episode, severe without psychotic features: Secondary | ICD-10-CM | POA: Diagnosis not present

## 2020-01-15 DIAGNOSIS — F1120 Opioid dependence, uncomplicated: Secondary | ICD-10-CM | POA: Diagnosis not present

## 2020-01-15 DIAGNOSIS — F322 Major depressive disorder, single episode, severe without psychotic features: Secondary | ICD-10-CM | POA: Diagnosis not present

## 2020-01-15 DIAGNOSIS — F1320 Sedative, hypnotic or anxiolytic dependence, uncomplicated: Secondary | ICD-10-CM | POA: Diagnosis not present

## 2020-01-15 DIAGNOSIS — F1020 Alcohol dependence, uncomplicated: Secondary | ICD-10-CM | POA: Diagnosis not present

## 2020-01-17 DIAGNOSIS — F322 Major depressive disorder, single episode, severe without psychotic features: Secondary | ICD-10-CM | POA: Diagnosis not present

## 2020-01-17 DIAGNOSIS — F1120 Opioid dependence, uncomplicated: Secondary | ICD-10-CM | POA: Diagnosis not present

## 2020-01-17 DIAGNOSIS — F1320 Sedative, hypnotic or anxiolytic dependence, uncomplicated: Secondary | ICD-10-CM | POA: Diagnosis not present

## 2020-01-20 DIAGNOSIS — F1320 Sedative, hypnotic or anxiolytic dependence, uncomplicated: Secondary | ICD-10-CM | POA: Diagnosis not present

## 2020-01-20 DIAGNOSIS — F322 Major depressive disorder, single episode, severe without psychotic features: Secondary | ICD-10-CM | POA: Diagnosis not present

## 2020-01-20 DIAGNOSIS — F1120 Opioid dependence, uncomplicated: Secondary | ICD-10-CM | POA: Diagnosis not present

## 2020-01-21 DIAGNOSIS — F1120 Opioid dependence, uncomplicated: Secondary | ICD-10-CM | POA: Diagnosis not present

## 2020-01-21 DIAGNOSIS — F1320 Sedative, hypnotic or anxiolytic dependence, uncomplicated: Secondary | ICD-10-CM | POA: Diagnosis not present

## 2020-01-21 DIAGNOSIS — F322 Major depressive disorder, single episode, severe without psychotic features: Secondary | ICD-10-CM | POA: Diagnosis not present

## 2020-01-22 DIAGNOSIS — F1020 Alcohol dependence, uncomplicated: Secondary | ICD-10-CM | POA: Diagnosis not present

## 2020-01-22 DIAGNOSIS — F1320 Sedative, hypnotic or anxiolytic dependence, uncomplicated: Secondary | ICD-10-CM | POA: Diagnosis not present

## 2020-01-22 DIAGNOSIS — F322 Major depressive disorder, single episode, severe without psychotic features: Secondary | ICD-10-CM | POA: Diagnosis not present

## 2020-01-22 DIAGNOSIS — F1120 Opioid dependence, uncomplicated: Secondary | ICD-10-CM | POA: Diagnosis not present

## 2020-01-23 DIAGNOSIS — F1320 Sedative, hypnotic or anxiolytic dependence, uncomplicated: Secondary | ICD-10-CM | POA: Diagnosis not present

## 2020-01-23 DIAGNOSIS — F322 Major depressive disorder, single episode, severe without psychotic features: Secondary | ICD-10-CM | POA: Diagnosis not present

## 2020-01-23 DIAGNOSIS — F1120 Opioid dependence, uncomplicated: Secondary | ICD-10-CM | POA: Diagnosis not present

## 2020-01-24 DIAGNOSIS — F322 Major depressive disorder, single episode, severe without psychotic features: Secondary | ICD-10-CM | POA: Diagnosis not present

## 2020-01-24 DIAGNOSIS — F1120 Opioid dependence, uncomplicated: Secondary | ICD-10-CM | POA: Diagnosis not present

## 2020-01-24 DIAGNOSIS — F1320 Sedative, hypnotic or anxiolytic dependence, uncomplicated: Secondary | ICD-10-CM | POA: Diagnosis not present

## 2020-01-27 DIAGNOSIS — F1120 Opioid dependence, uncomplicated: Secondary | ICD-10-CM | POA: Diagnosis not present

## 2020-01-27 DIAGNOSIS — F1320 Sedative, hypnotic or anxiolytic dependence, uncomplicated: Secondary | ICD-10-CM | POA: Diagnosis not present

## 2020-01-27 DIAGNOSIS — F322 Major depressive disorder, single episode, severe without psychotic features: Secondary | ICD-10-CM | POA: Diagnosis not present

## 2020-01-28 DIAGNOSIS — F1320 Sedative, hypnotic or anxiolytic dependence, uncomplicated: Secondary | ICD-10-CM | POA: Diagnosis not present

## 2020-01-28 DIAGNOSIS — F322 Major depressive disorder, single episode, severe without psychotic features: Secondary | ICD-10-CM | POA: Diagnosis not present

## 2020-01-28 DIAGNOSIS — F1120 Opioid dependence, uncomplicated: Secondary | ICD-10-CM | POA: Diagnosis not present

## 2020-01-29 DIAGNOSIS — F322 Major depressive disorder, single episode, severe without psychotic features: Secondary | ICD-10-CM | POA: Diagnosis not present

## 2020-01-29 DIAGNOSIS — F1020 Alcohol dependence, uncomplicated: Secondary | ICD-10-CM | POA: Diagnosis not present

## 2020-01-29 DIAGNOSIS — F1320 Sedative, hypnotic or anxiolytic dependence, uncomplicated: Secondary | ICD-10-CM | POA: Diagnosis not present

## 2020-01-29 DIAGNOSIS — F1120 Opioid dependence, uncomplicated: Secondary | ICD-10-CM | POA: Diagnosis not present

## 2020-01-30 DIAGNOSIS — F1120 Opioid dependence, uncomplicated: Secondary | ICD-10-CM | POA: Diagnosis not present

## 2020-01-30 DIAGNOSIS — F1320 Sedative, hypnotic or anxiolytic dependence, uncomplicated: Secondary | ICD-10-CM | POA: Diagnosis not present

## 2020-01-30 DIAGNOSIS — F322 Major depressive disorder, single episode, severe without psychotic features: Secondary | ICD-10-CM | POA: Diagnosis not present

## 2020-01-31 DIAGNOSIS — F1120 Opioid dependence, uncomplicated: Secondary | ICD-10-CM | POA: Diagnosis not present

## 2020-01-31 DIAGNOSIS — F1320 Sedative, hypnotic or anxiolytic dependence, uncomplicated: Secondary | ICD-10-CM | POA: Diagnosis not present

## 2020-01-31 DIAGNOSIS — F322 Major depressive disorder, single episode, severe without psychotic features: Secondary | ICD-10-CM | POA: Diagnosis not present

## 2020-02-03 DIAGNOSIS — F322 Major depressive disorder, single episode, severe without psychotic features: Secondary | ICD-10-CM | POA: Diagnosis not present

## 2020-02-03 DIAGNOSIS — F1120 Opioid dependence, uncomplicated: Secondary | ICD-10-CM | POA: Diagnosis not present

## 2020-02-03 DIAGNOSIS — F1320 Sedative, hypnotic or anxiolytic dependence, uncomplicated: Secondary | ICD-10-CM | POA: Diagnosis not present

## 2020-02-04 DIAGNOSIS — F322 Major depressive disorder, single episode, severe without psychotic features: Secondary | ICD-10-CM | POA: Diagnosis not present

## 2020-02-04 DIAGNOSIS — F1120 Opioid dependence, uncomplicated: Secondary | ICD-10-CM | POA: Diagnosis not present

## 2020-02-04 DIAGNOSIS — F1320 Sedative, hypnotic or anxiolytic dependence, uncomplicated: Secondary | ICD-10-CM | POA: Diagnosis not present

## 2020-02-05 DIAGNOSIS — F1120 Opioid dependence, uncomplicated: Secondary | ICD-10-CM | POA: Diagnosis not present

## 2020-02-05 DIAGNOSIS — F322 Major depressive disorder, single episode, severe without psychotic features: Secondary | ICD-10-CM | POA: Diagnosis not present

## 2020-02-05 DIAGNOSIS — F1320 Sedative, hypnotic or anxiolytic dependence, uncomplicated: Secondary | ICD-10-CM | POA: Diagnosis not present

## 2020-02-06 DIAGNOSIS — F322 Major depressive disorder, single episode, severe without psychotic features: Secondary | ICD-10-CM | POA: Diagnosis not present

## 2020-02-06 DIAGNOSIS — F1320 Sedative, hypnotic or anxiolytic dependence, uncomplicated: Secondary | ICD-10-CM | POA: Diagnosis not present

## 2020-02-06 DIAGNOSIS — F1120 Opioid dependence, uncomplicated: Secondary | ICD-10-CM | POA: Diagnosis not present

## 2020-02-07 DIAGNOSIS — F322 Major depressive disorder, single episode, severe without psychotic features: Secondary | ICD-10-CM | POA: Diagnosis not present

## 2020-02-07 DIAGNOSIS — F1120 Opioid dependence, uncomplicated: Secondary | ICD-10-CM | POA: Diagnosis not present

## 2020-02-07 DIAGNOSIS — F1320 Sedative, hypnotic or anxiolytic dependence, uncomplicated: Secondary | ICD-10-CM | POA: Diagnosis not present

## 2020-02-10 DIAGNOSIS — F322 Major depressive disorder, single episode, severe without psychotic features: Secondary | ICD-10-CM | POA: Diagnosis not present

## 2020-02-10 DIAGNOSIS — F1320 Sedative, hypnotic or anxiolytic dependence, uncomplicated: Secondary | ICD-10-CM | POA: Diagnosis not present

## 2020-02-10 DIAGNOSIS — F1120 Opioid dependence, uncomplicated: Secondary | ICD-10-CM | POA: Diagnosis not present

## 2020-02-11 DIAGNOSIS — F1320 Sedative, hypnotic or anxiolytic dependence, uncomplicated: Secondary | ICD-10-CM | POA: Diagnosis not present

## 2020-02-11 DIAGNOSIS — F1020 Alcohol dependence, uncomplicated: Secondary | ICD-10-CM | POA: Diagnosis not present

## 2020-02-11 DIAGNOSIS — F322 Major depressive disorder, single episode, severe without psychotic features: Secondary | ICD-10-CM | POA: Diagnosis not present

## 2020-02-11 DIAGNOSIS — F1120 Opioid dependence, uncomplicated: Secondary | ICD-10-CM | POA: Diagnosis not present

## 2020-02-12 DIAGNOSIS — F1120 Opioid dependence, uncomplicated: Secondary | ICD-10-CM | POA: Diagnosis not present

## 2020-02-12 DIAGNOSIS — F322 Major depressive disorder, single episode, severe without psychotic features: Secondary | ICD-10-CM | POA: Diagnosis not present

## 2020-02-12 DIAGNOSIS — F1320 Sedative, hypnotic or anxiolytic dependence, uncomplicated: Secondary | ICD-10-CM | POA: Diagnosis not present

## 2020-02-13 DIAGNOSIS — F1320 Sedative, hypnotic or anxiolytic dependence, uncomplicated: Secondary | ICD-10-CM | POA: Diagnosis not present

## 2020-02-13 DIAGNOSIS — F322 Major depressive disorder, single episode, severe without psychotic features: Secondary | ICD-10-CM | POA: Diagnosis not present

## 2020-02-13 DIAGNOSIS — F1120 Opioid dependence, uncomplicated: Secondary | ICD-10-CM | POA: Diagnosis not present

## 2020-02-14 DIAGNOSIS — F322 Major depressive disorder, single episode, severe without psychotic features: Secondary | ICD-10-CM | POA: Diagnosis not present

## 2020-02-14 DIAGNOSIS — F1120 Opioid dependence, uncomplicated: Secondary | ICD-10-CM | POA: Diagnosis not present

## 2020-02-14 DIAGNOSIS — F1320 Sedative, hypnotic or anxiolytic dependence, uncomplicated: Secondary | ICD-10-CM | POA: Diagnosis not present

## 2020-02-17 DIAGNOSIS — F1120 Opioid dependence, uncomplicated: Secondary | ICD-10-CM | POA: Diagnosis not present

## 2020-02-17 DIAGNOSIS — F1320 Sedative, hypnotic or anxiolytic dependence, uncomplicated: Secondary | ICD-10-CM | POA: Diagnosis not present

## 2020-02-17 DIAGNOSIS — F322 Major depressive disorder, single episode, severe without psychotic features: Secondary | ICD-10-CM | POA: Diagnosis not present

## 2020-02-18 DIAGNOSIS — F1120 Opioid dependence, uncomplicated: Secondary | ICD-10-CM | POA: Diagnosis not present

## 2020-02-18 DIAGNOSIS — F322 Major depressive disorder, single episode, severe without psychotic features: Secondary | ICD-10-CM | POA: Diagnosis not present

## 2020-02-18 DIAGNOSIS — F1320 Sedative, hypnotic or anxiolytic dependence, uncomplicated: Secondary | ICD-10-CM | POA: Diagnosis not present

## 2020-02-19 DIAGNOSIS — F1320 Sedative, hypnotic or anxiolytic dependence, uncomplicated: Secondary | ICD-10-CM | POA: Diagnosis not present

## 2020-02-19 DIAGNOSIS — F1120 Opioid dependence, uncomplicated: Secondary | ICD-10-CM | POA: Diagnosis not present

## 2020-02-19 DIAGNOSIS — F322 Major depressive disorder, single episode, severe without psychotic features: Secondary | ICD-10-CM | POA: Diagnosis not present

## 2020-02-20 DIAGNOSIS — F1120 Opioid dependence, uncomplicated: Secondary | ICD-10-CM | POA: Diagnosis not present

## 2020-02-20 DIAGNOSIS — F322 Major depressive disorder, single episode, severe without psychotic features: Secondary | ICD-10-CM | POA: Diagnosis not present

## 2020-02-20 DIAGNOSIS — F1320 Sedative, hypnotic or anxiolytic dependence, uncomplicated: Secondary | ICD-10-CM | POA: Diagnosis not present

## 2020-02-21 DIAGNOSIS — F322 Major depressive disorder, single episode, severe without psychotic features: Secondary | ICD-10-CM | POA: Diagnosis not present

## 2020-02-21 DIAGNOSIS — F1120 Opioid dependence, uncomplicated: Secondary | ICD-10-CM | POA: Diagnosis not present

## 2020-02-21 DIAGNOSIS — F1320 Sedative, hypnotic or anxiolytic dependence, uncomplicated: Secondary | ICD-10-CM | POA: Diagnosis not present

## 2020-02-24 DIAGNOSIS — F1120 Opioid dependence, uncomplicated: Secondary | ICD-10-CM | POA: Diagnosis not present

## 2020-02-24 DIAGNOSIS — F322 Major depressive disorder, single episode, severe without psychotic features: Secondary | ICD-10-CM | POA: Diagnosis not present

## 2020-02-24 DIAGNOSIS — F1320 Sedative, hypnotic or anxiolytic dependence, uncomplicated: Secondary | ICD-10-CM | POA: Diagnosis not present

## 2020-03-02 DIAGNOSIS — F17210 Nicotine dependence, cigarettes, uncomplicated: Secondary | ICD-10-CM | POA: Diagnosis not present

## 2020-03-02 DIAGNOSIS — F330 Major depressive disorder, recurrent, mild: Secondary | ICD-10-CM | POA: Diagnosis not present

## 2020-03-02 DIAGNOSIS — F1121 Opioid dependence, in remission: Secondary | ICD-10-CM | POA: Diagnosis not present

## 2020-03-02 DIAGNOSIS — F4011 Social phobia, generalized: Secondary | ICD-10-CM | POA: Diagnosis not present

## 2020-03-16 DIAGNOSIS — F330 Major depressive disorder, recurrent, mild: Secondary | ICD-10-CM | POA: Diagnosis not present

## 2020-03-16 DIAGNOSIS — F4011 Social phobia, generalized: Secondary | ICD-10-CM | POA: Diagnosis not present

## 2020-03-16 DIAGNOSIS — F17210 Nicotine dependence, cigarettes, uncomplicated: Secondary | ICD-10-CM | POA: Diagnosis not present

## 2020-03-16 DIAGNOSIS — F1121 Opioid dependence, in remission: Secondary | ICD-10-CM | POA: Diagnosis not present

## 2020-09-29 ENCOUNTER — Encounter: Payer: BC Managed Care – PPO | Admitting: Physician Assistant
